# Patient Record
Sex: Male | Born: 1965 | Race: White | Hispanic: No | Marital: Married | Smoking: Former smoker
Health system: Southern US, Community
[De-identification: ages and names within clinical notes are randomized; demographics above are authoritative.]

## PROBLEM LIST (undated history)

## (undated) DIAGNOSIS — C801 Malignant (primary) neoplasm, unspecified: Secondary | ICD-10-CM

## (undated) DIAGNOSIS — E785 Hyperlipidemia, unspecified: Secondary | ICD-10-CM

## (undated) HISTORY — DX: Malignant (primary) neoplasm, unspecified: C80.1

## (undated) HISTORY — PX: KNEE ARTHROSCOPY W/ ACL RECONSTRUCTION: SHX1858

## (undated) HISTORY — PX: NASAL SINUS SURGERY: SHX719

## (undated) HISTORY — DX: Hyperlipidemia, unspecified: E78.5

## (undated) HISTORY — PX: TONSILLECTOMY: SUR1361

## (undated) HISTORY — PX: KNEE ARTHROSCOPY: SUR90

---

## 2004-08-09 ENCOUNTER — Encounter: Admission: RE | Admit: 2004-08-09 | Discharge: 2004-08-09 | Payer: Self-pay | Admitting: Specialist

## 2005-01-18 ENCOUNTER — Ambulatory Visit (HOSPITAL_COMMUNITY): Admission: RE | Admit: 2005-01-18 | Discharge: 2005-01-18 | Payer: Self-pay | Admitting: Internal Medicine

## 2005-01-18 ENCOUNTER — Encounter (INDEPENDENT_AMBULATORY_CARE_PROVIDER_SITE_OTHER): Payer: Self-pay | Admitting: *Deleted

## 2005-01-27 ENCOUNTER — Encounter (INDEPENDENT_AMBULATORY_CARE_PROVIDER_SITE_OTHER): Payer: Self-pay | Admitting: *Deleted

## 2005-01-27 ENCOUNTER — Ambulatory Visit (HOSPITAL_COMMUNITY): Admission: RE | Admit: 2005-01-27 | Discharge: 2005-01-27 | Payer: Self-pay | Admitting: Surgery

## 2006-06-13 DIAGNOSIS — C801 Malignant (primary) neoplasm, unspecified: Secondary | ICD-10-CM

## 2006-06-13 HISTORY — PX: THYROIDECTOMY, PARTIAL: SHX18

## 2006-06-13 HISTORY — DX: Malignant (primary) neoplasm, unspecified: C80.1

## 2007-10-28 ENCOUNTER — Encounter: Admission: RE | Admit: 2007-10-28 | Discharge: 2007-10-28 | Payer: Self-pay | Admitting: Neurosurgery

## 2007-10-29 ENCOUNTER — Encounter: Admission: RE | Admit: 2007-10-29 | Discharge: 2007-10-29 | Payer: Self-pay | Admitting: Neurosurgery

## 2009-01-08 IMAGING — CR DG LUMBAR SPINE COMPLETE W/ BEND
7 series · 7 of 7 positions shown · non-contrast
Comparison: None

CLINICAL DATA: Low back pain radiating to both legs, left greater
than right

LUMBAR SPINE - COMPLETE WITH BENDING VIEWS

[view not recorded (1 of 7)]
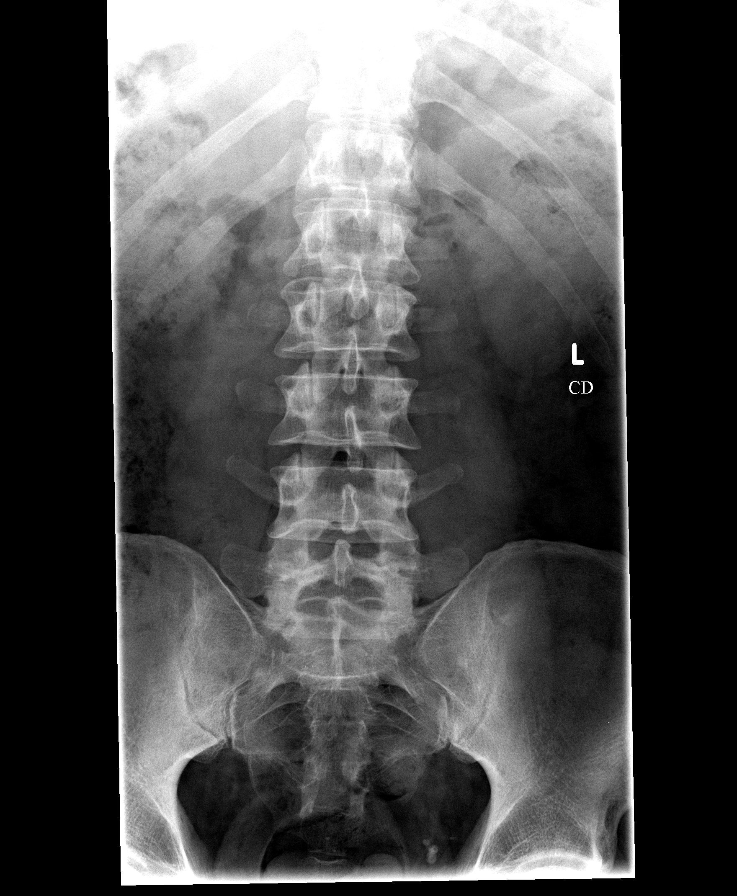

[view not recorded (2 of 7)]
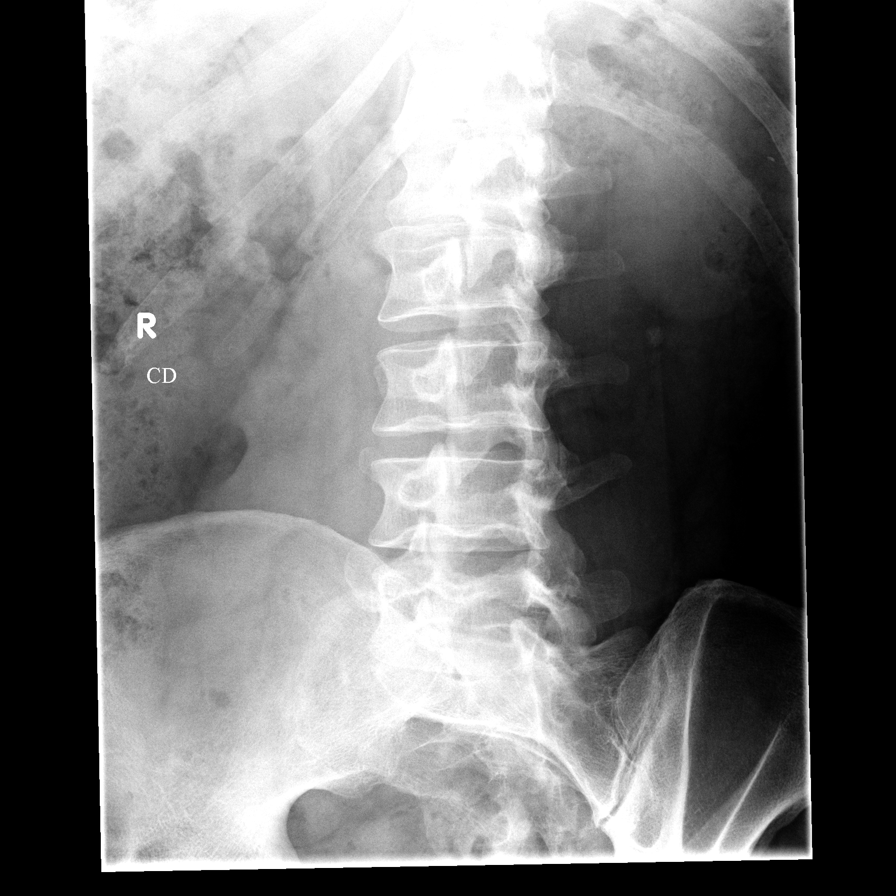

[view not recorded (3 of 7)]
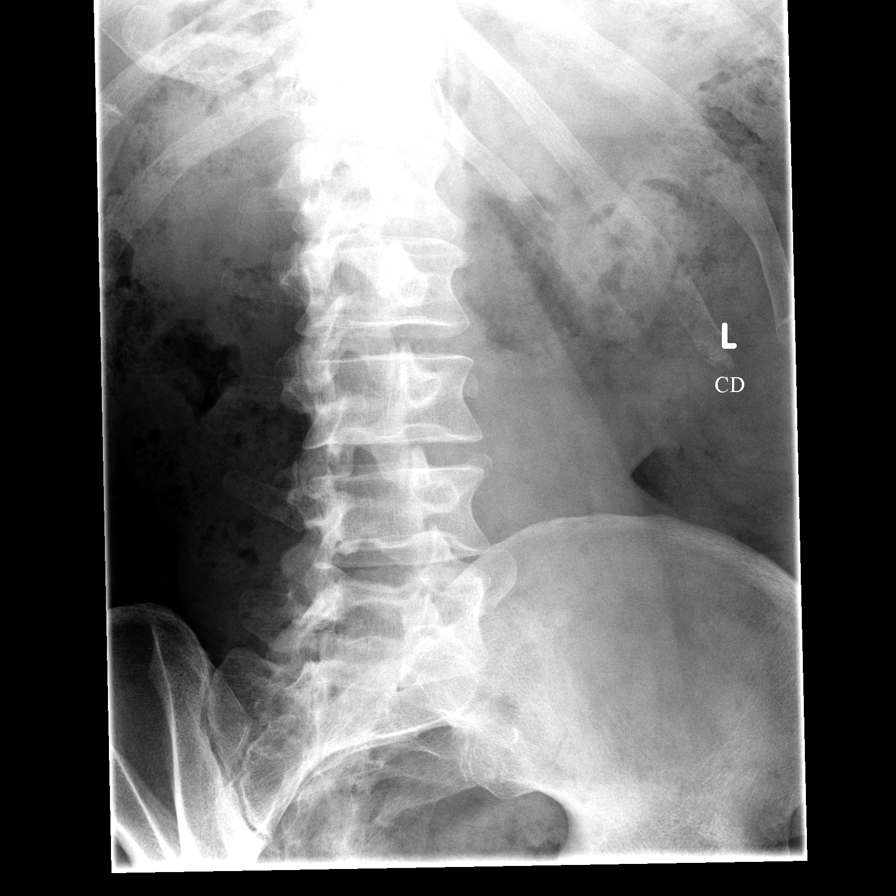

[view not recorded (4 of 7)]
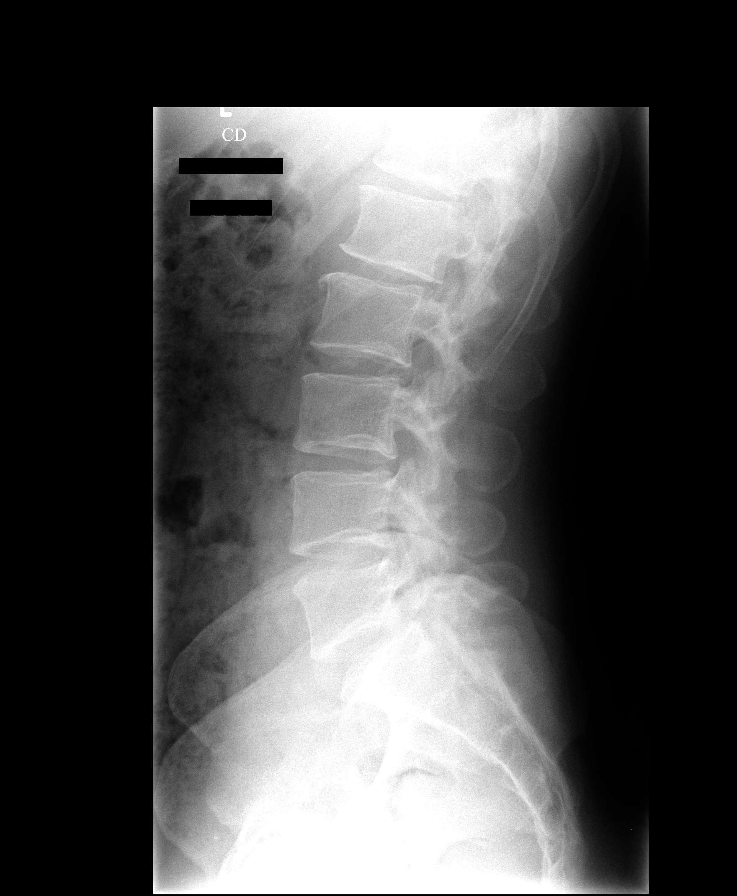

[view not recorded (5 of 7)]
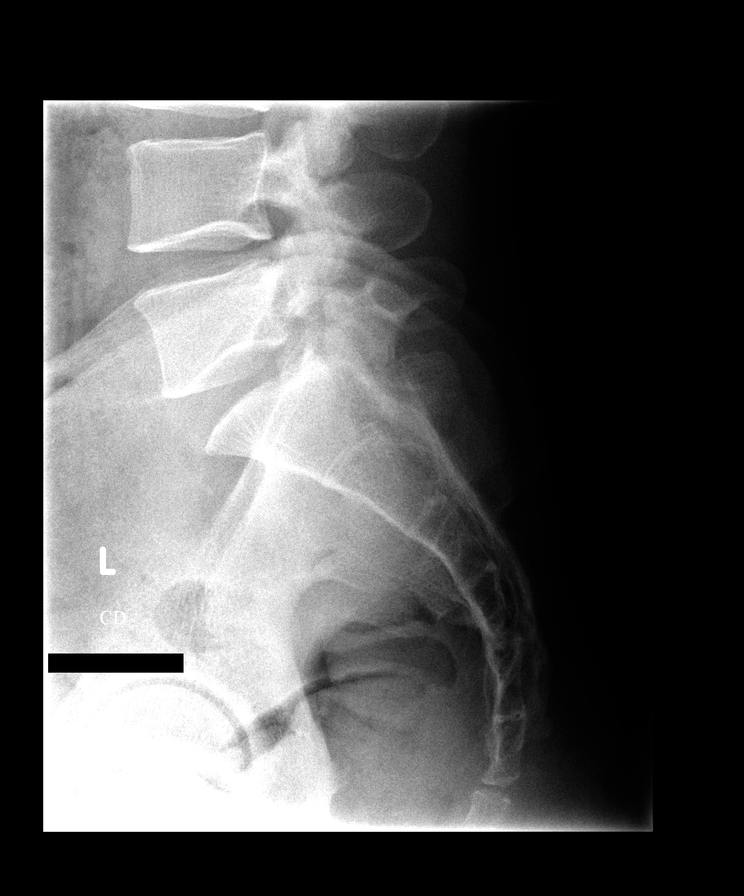

[view not recorded (6 of 7)]
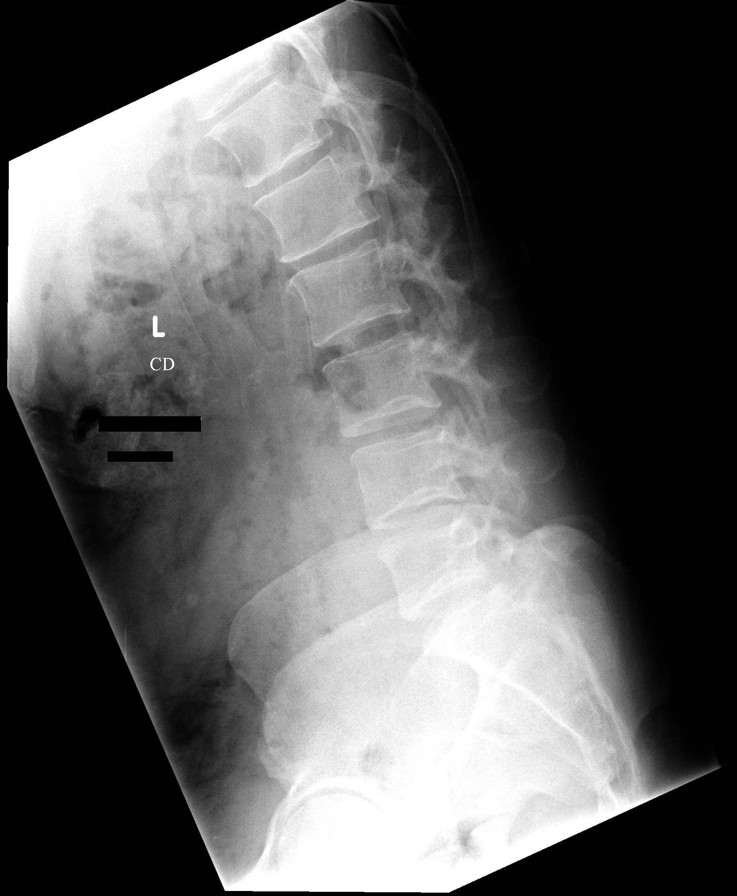

[view not recorded (7 of 7)]
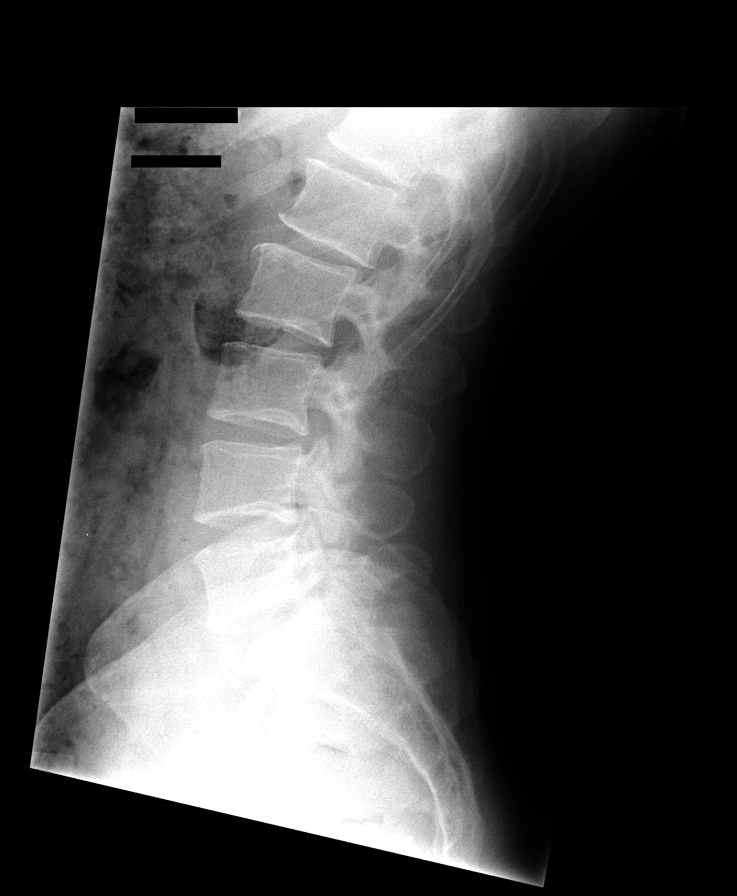

[7 of 7 positions shown; findings below may reference images not displayed]

FINDINGS: There are five typical lumbar segments.  There are
bilateral pars defects at L5 with a 3- 4 mm spondylolisthesis of
L5 on S1.  The disc spaces are well maintained except for slight
narrowing at L1-2. No significant facet joint disease.
IMPRESSION: Bilateral pars defects at L5 with a grade 1 spondylolisthesis of L5
on S1.

## 2010-10-29 NOTE — Op Note (Signed)
NAMEJASAI, Samuel Malone                 ACCOUNT NO.:  [id number]   MEDICAL RECORD NO.:  [id number]          PATIENT TYPE:  OIB   LOCATION:  5734                         FACILITY:  [facility]   PHYSICIAN:  Velora Heckler, MD      DATE OF BIRTH:  Jul 01, 1965   DATE OF PROCEDURE:  01/27/2005  DATE OF DISCHARGE:                                 OPERATIVE REPORT   PREOPERATIVE DIAGNOSIS:  Right thyroid nodule.   POSTOPERATIVE DIAGNOSIS:  Right thyroid papillary carcinoma (minimal)   PROCEDURE:  Right thyroid lobectomy.   SURGEON:  Velora Heckler, M.D.   ANESTHESIA:  General per Dr. Kipp Brood.   ESTIMATED BLOOD LOSS:  Minimal.   PREPARATION:  Betaine.   COMPLICATIONS:  None.   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  The patient had a known right thyroid nodule found on  ultrasound examination two years ago. The patient had a repeat ultrasound  two weeks ago which demonstrated an approximately 8 mm right thyroid nodule.  This represented an interval increase in size.  The patient therefore  underwent ultrasound guided needle aspirate which showed cellular atypia  worrisome for papillary carcinoma.  The patient now comes to surgery for  resection.   DESCRIPTION OF PROCEDURE:  The procedure was done in OR #16 at the [facility]. The patient was brought to the operating room and  placed in supine position on the operating room table.  Following  administration of general anesthesia, the patient is positioned and then  prepped and draped in usual strict aseptic fashion.  After ascertaining that  an adequate level of anesthesia had been obtained, a Kocher incision was  made with a #15 blade. Dissection was carried down through subcutaneous  tissues and platysma.  Hemostasis was obtained with the electrocautery.  Skin flaps were developed cephalad and caudad from the thyroid notch to the  sternal notch.  Mahorner  self-retaining retractor was placed for exposure.  Strap muscles were incised in the midline.  Dissection was begun on the left  side.  Strap muscles were elevated off the left thyroid lobe.  Left thyroid  lobe was palpated.  It is small, uniform without nodularity.  There is no  gross abnormality.   Next we turned our attention to the right thyroid lobe. Strap muscles were  reflected laterally. Strap muscles are somewhat adherent to the surface of  the thyroid capsule due to previous with recent needle biopsy.  There is no  hematoma.  Strap muscles were reflected laterally and thyroid lobe was  completely exposed.  Middle thyroid vein was divided between small  Ligaclips.  Inferior venous tributaries were divided between small  Ligaclips.  The gland is completely mobilized.  The superior pole vessels  are divided between 2-0 silk ties and medium Ligaclips.  The gland is  gradually rolled anteriorly. Branches of the inferior thyroid artery were  divided between small Ligaclips.  Parathyroid tissue was identified and  preserved.  Recurrent laryngeal nerve was identified and preserved.  Dissection was  carried down to the ligament of Berry.  Thyroid isthmus was  mobilized with the electrocautery and transected between hemostats.  It was  suture ligated with 3-0 Vicryl suture ligature.  The gland is then rolled  further anteriorly in the ligament of Berry, transected with electrocautery.  The gland is rolled up and onto the anterior trachea from which it was  completely excised with the electrocautery.  Right thyroid lobe was  submitted to pathology where Dr. Esther Hardy performed frozen section  biopsy. This showed a 6 mm nodule.  Surgical margins were widely clear.  Histology appears to be a papillary thyroid cancer.  Central compartment  lymph nodes were not prominent.  A small amount of tissue was taken from  anterior to the trachea and submitted as central compartment lymph node.  Good  hemostasis was noted.  Neck is irrigated with warm saline.  Surgicel  was placed over the area of the recurrent nerve and parathyroid glands on  the right side of the neck. Strap muscles were reapproximated in the midline  with interrupted 3-0 Vicryl sutures.  Platysma was closed with interrupted 3-  0 Vicryl sutures.  Skin edges were reapproximated with a running 4-0 Vicryl  subcuticular suture. Wound is washed and dried, and Benzoin, Steri-Strips  were applied.  Sterile dressings were applied. The patient is awakened from  anesthesia and brought to the recovery room in stable condition. The patient  tolerated the procedure well.      Velora Heckler, MD  Electronically Signed     TMG/MEDQ  D:  01/27/2005  T:  01/27/2005  Job:  [id number]   cc:   Kari Baars, M.D.  [address]  [place], [place] [address]  Fax: [phone]   Tera Mater. Evlyn Kanner, M.D.  [address]  [place]  [place] [address]  Fax: [phone]   Art A. Tilmon, M.D.  [address]  [place], [place] [address]

## 2013-06-13 HISTORY — PX: WRIST FRACTURE SURGERY: SHX121

## 2015-10-09 ENCOUNTER — Other Ambulatory Visit: Payer: Self-pay | Admitting: Orthopedic Surgery

## 2015-10-09 DIAGNOSIS — S62309D Unspecified fracture of unspecified metacarpal bone, subsequent encounter for fracture with routine healing: Secondary | ICD-10-CM

## 2015-10-12 ENCOUNTER — Ambulatory Visit
Admission: RE | Admit: 2015-10-12 | Discharge: 2015-10-12 | Disposition: A | Discharge status: Home / Self Care | Payer: PRIVATE HEALTH INSURANCE | Source: Ambulatory Visit | Attending: Orthopedic Surgery | Admitting: Orthopedic Surgery

## 2015-10-12 DIAGNOSIS — S62309D Unspecified fracture of unspecified metacarpal bone, subsequent encounter for fracture with routine healing: Secondary | ICD-10-CM

## 2016-12-22 IMAGING — CR DG HAND COMPLETE 3+V*R*
3 series · 3 of 3 positions shown · non-contrast
Comparison: None

CLINICAL DATA: Fifth metacarpal bone fracture.  Assess for healing.

EXAM:
RIGHT HAND - COMPLETE 3+ VIEW

[x hand pa right]
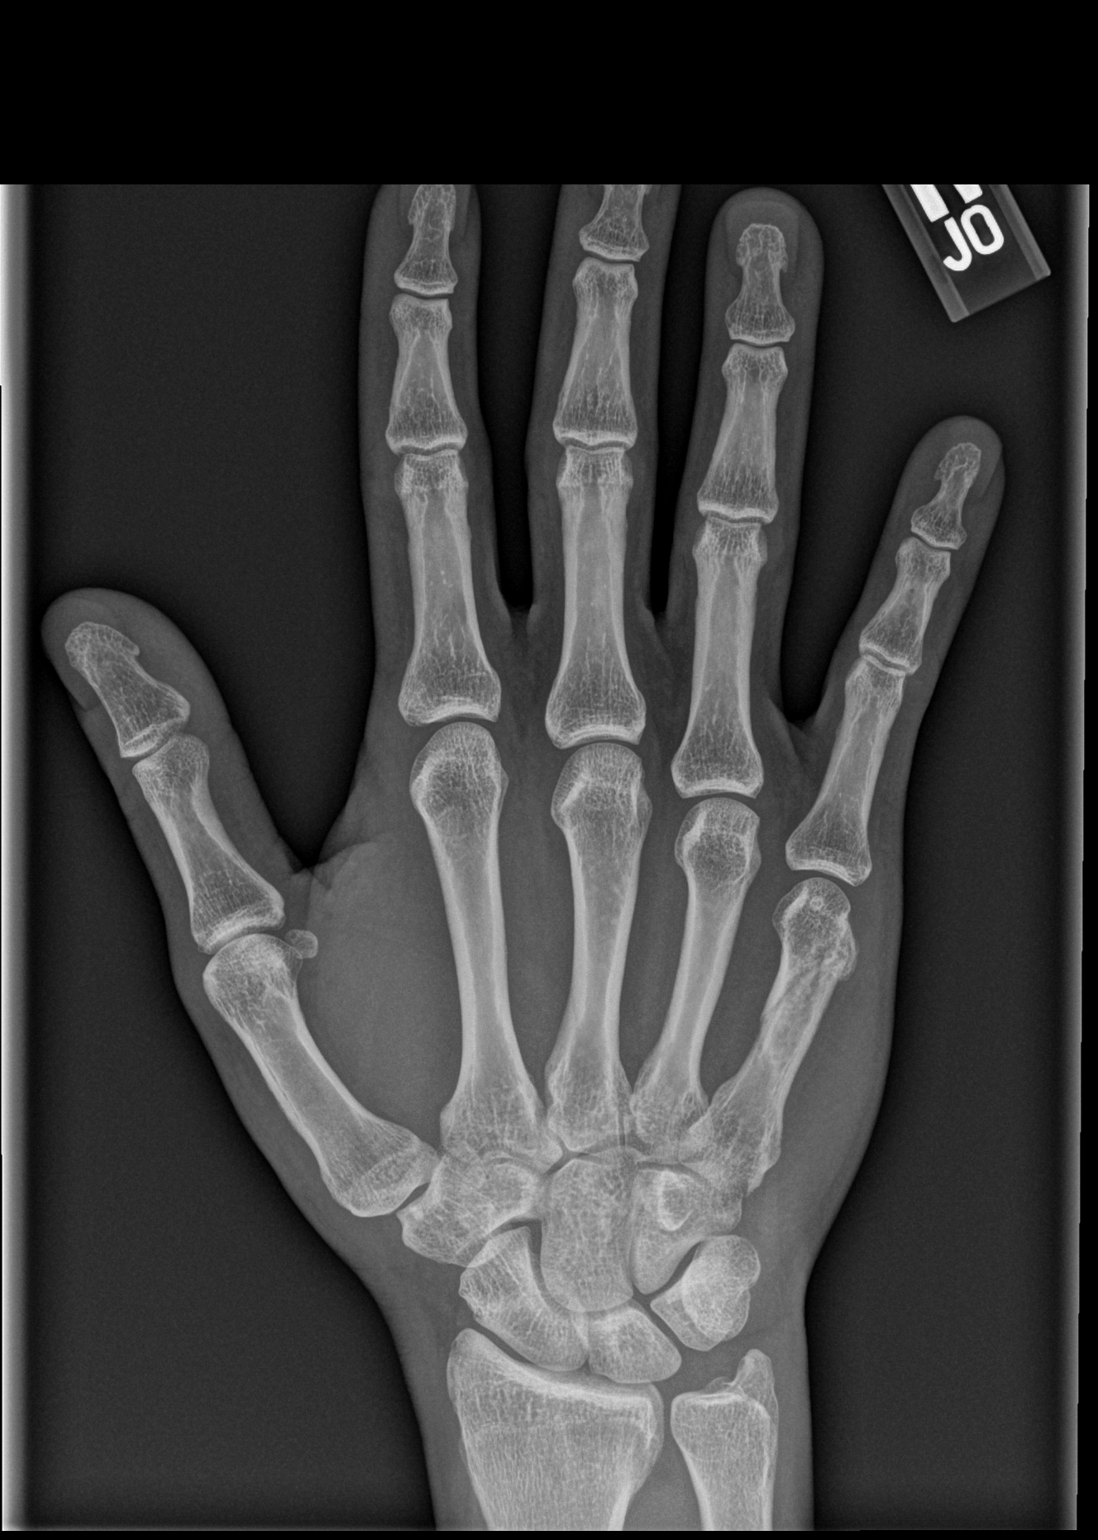

[x hand obl right]
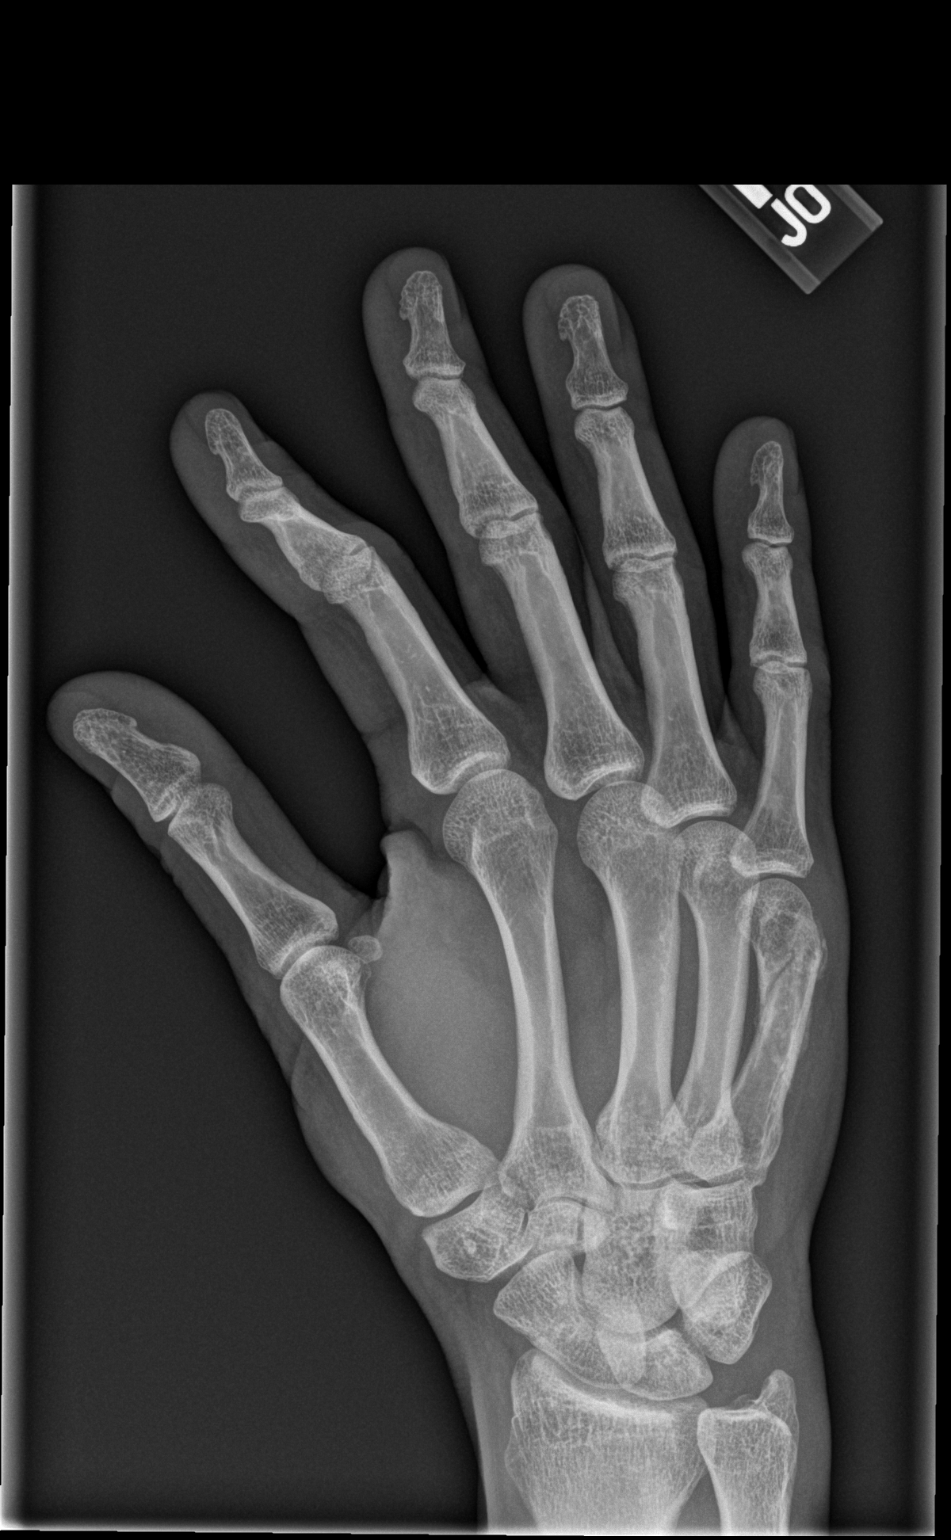

[x hand lat right]
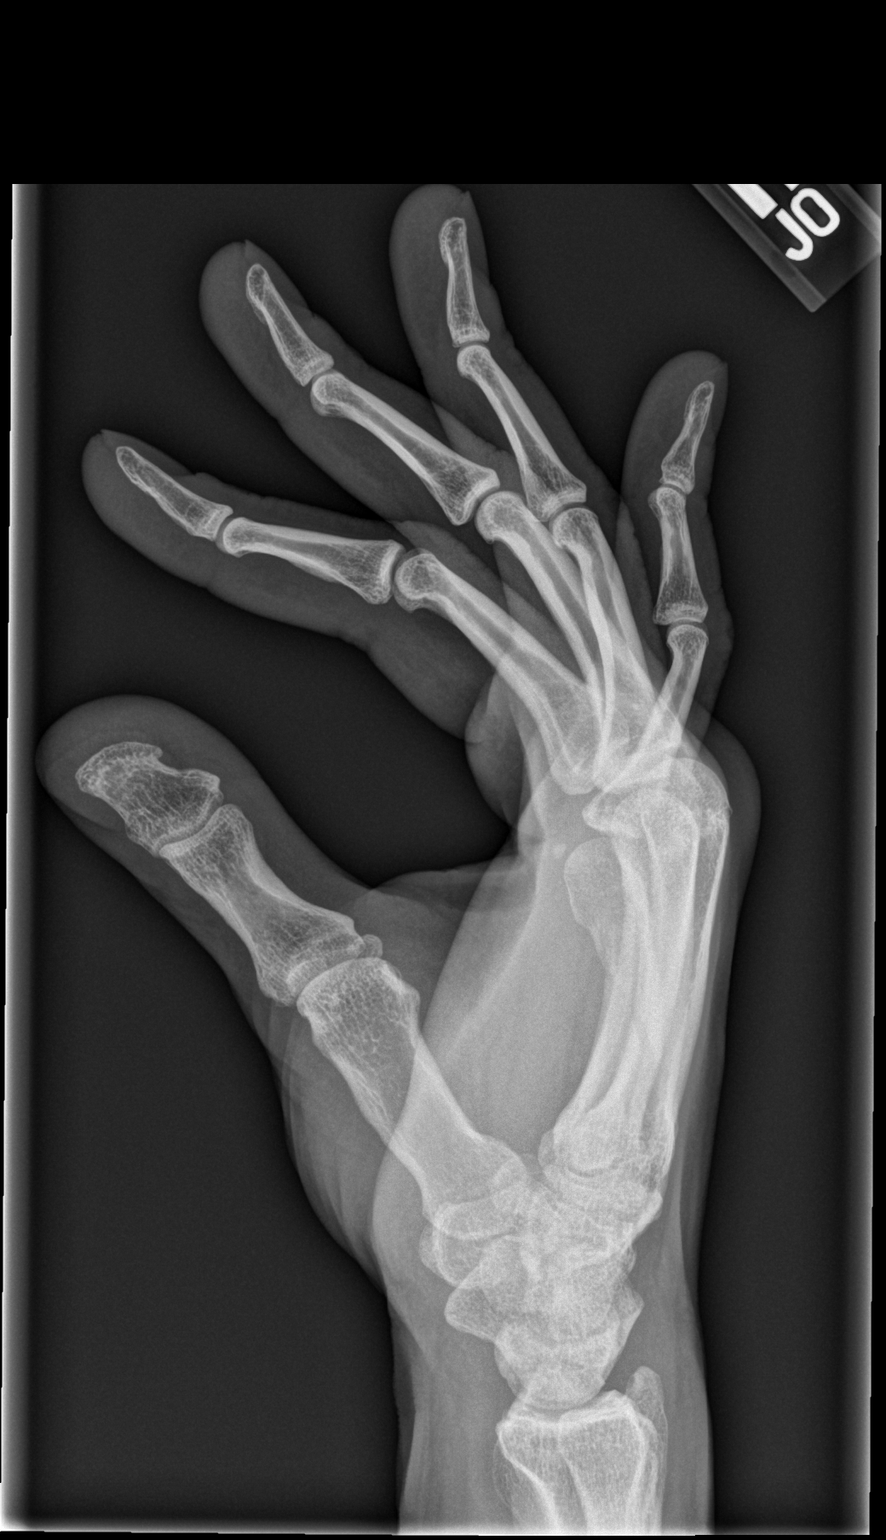

[3 of 3 positions shown; findings below may reference images not displayed]

FINDINGS: There is a a healing fracture deformity involving the fifth
metacarpal bone. Mild volar angulation of the distal fracture
fragments again noted. The fracture line appears less distinct.
There is no new fracture or subluxation identified.
IMPRESSION: 1. Healing fracture of the fifth metacarpal bone.

## 2018-01-23 ENCOUNTER — Encounter: Payer: Self-pay | Admitting: Internal Medicine

## 2018-03-07 DIAGNOSIS — M1712 Unilateral primary osteoarthritis, left knee: Secondary | ICD-10-CM | POA: Insufficient documentation

## 2018-03-22 ENCOUNTER — Telehealth: Payer: Self-pay | Admitting: *Deleted

## 2018-03-22 ENCOUNTER — Ambulatory Visit (AMBULATORY_SURGERY_CENTER): Payer: Self-pay | Admitting: *Deleted

## 2018-03-22 VITALS — Ht 71.0 in | Wt 194.0 lb

## 2018-03-22 DIAGNOSIS — Z1211 Encounter for screening for malignant neoplasm of colon: Secondary | ICD-10-CM

## 2018-03-22 NOTE — Telephone Encounter (Signed)
Patient called Dr.Gessner and he is trying to find a ride for his up coming colonoscopy. Patient is to call back and speak directly with Dr.Gessner. Patient will need colonoscopy instructions. Pre-visit was completed except instructions.

## 2018-03-22 NOTE — Progress Notes (Signed)
Patient denies any allergies to eggs or soy. Patient denies any problems with anesthesia/sedation. Patient denies any oxygen use at home. Patient states he was told he has a "small airway". Pt denies difficulty with intubation. During colonoscopy instructions patient got upset and states that he does not have a care-partner that can stay, offered different dates that was available for Dr.Gessner but pt states he can not do those dates. Then I offered [facility] and he said "no, that he will call Glendell Docker later". I told patient that I was going to cancel the colonoscopy for 04/05/18, patient got up and walked out of my PV room.

## 2018-03-29 NOTE — Telephone Encounter (Signed)
Pt cancelled his 10-24 colon

## 2018-04-05 ENCOUNTER — Encounter: Payer: PRIVATE HEALTH INSURANCE | Admitting: Internal Medicine

## 2018-11-13 ENCOUNTER — Emergency Department (HOSPITAL_COMMUNITY)
Admission: EM | Admit: 2018-11-13 | Discharge: 2018-12-12 | Disposition: E | Payer: BC Managed Care – PPO | Attending: Emergency Medicine | Admitting: Emergency Medicine

## 2018-11-13 ENCOUNTER — Encounter (HOSPITAL_COMMUNITY): Payer: Self-pay | Admitting: Emergency Medicine

## 2018-11-13 DIAGNOSIS — R55 Syncope and collapse: Secondary | ICD-10-CM | POA: Diagnosis present

## 2018-11-13 DIAGNOSIS — I469 Cardiac arrest, cause unspecified: Secondary | ICD-10-CM | POA: Insufficient documentation

## 2018-11-13 LAB — CBG MONITORING, ED: Glucose-Capillary: 181 mg/dL — ABNORMAL HIGH (ref 70–99)

## 2018-11-13 MED ORDER — SODIUM BICARBONATE 8.4 % IV SOLN
INTRAVENOUS | Status: AC | PRN
  Administered 2018-11-13: 100 meq via INTRAVENOUS

## 2018-11-13 MED ORDER — EPINEPHRINE 1 MG/10ML IJ SOSY
PREFILLED_SYRINGE | INTRAMUSCULAR | Status: AC | PRN
  Administered 2018-11-13: 1 mg via INTRAVENOUS

## 2018-11-13 NOTE — Code Documentation (Signed)
Patient time of death occurred at 18:56

## 2018-11-13 NOTE — ED Notes (Signed)
Belongings have been given to the next of kin Seth Bake. (shoes, shorts and gloves)

## 2018-11-13 NOTE — Progress Notes (Signed)
   12/03/2018 2200  Clinical Encounter Type  Visited With Patient;Patient and family together  Visit Type Initial;Spiritual support;Death;ED  Referral From Nurse  Consult/Referral To Chaplain  Spiritual Encounters  Spiritual Needs Emotional;Prayer;Grief support  Stress Factors  Family Stress Factors Loss;Major life changes   Responded to page to go to ED for PT death. Family in Consult room. Met with Nurse and she stated PT was being prepared for viewing. Nurse was very compassionate and professional. After family was informed of Pt death by Doctor, I escorted x-wife and 3 adult sons into pt room for viewing. I gave family a patient placement card as they were still processing what to do with pt. I offered spiritual support with ministry of presence, and prayer. Family gave me contact information.   Kahner Yan[place]) [phone] [place] [address]  Chaplain Lizeth Bencosme  [phone]
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
## 2018-11-14 MED FILL — Medication: Qty: 1 | Status: AC

## 2018-12-12 NOTE — ED Notes (Signed)
CDS updated, Patient placement updated.  Patient to morgue.

## 2018-12-12 DEATH — deceased
